# Patient Record
Sex: Female | Born: 1935 | Race: White | Hispanic: No | Smoking: Never smoker
Health system: Southern US, Community
[De-identification: ages and names within clinical notes are randomized; demographics above are authoritative.]

## PROBLEM LIST (undated history)

## (undated) DIAGNOSIS — I719 Aortic aneurysm of unspecified site, without rupture: Secondary | ICD-10-CM

## (undated) DIAGNOSIS — I10 Essential (primary) hypertension: Secondary | ICD-10-CM

## (undated) DIAGNOSIS — I2510 Atherosclerotic heart disease of native coronary artery without angina pectoris: Secondary | ICD-10-CM

## (undated) HISTORY — PX: ABDOMINAL HYSTERECTOMY: SHX81

## (undated) HISTORY — PX: KNEE ARTHROPLASTY: SHX992

---

## 2012-10-13 ENCOUNTER — Emergency Department (HOSPITAL_COMMUNITY): Payer: Medicare Other

## 2012-10-13 ENCOUNTER — Encounter (HOSPITAL_COMMUNITY): Payer: Self-pay | Admitting: Emergency Medicine

## 2012-10-13 ENCOUNTER — Observation Stay (HOSPITAL_COMMUNITY)
Admission: EM | Admit: 2012-10-13 | Discharge: 2012-10-14 | Disposition: A | Discharge status: Home / Self Care | Payer: Medicare Other | Attending: Emergency Medicine | Admitting: Emergency Medicine

## 2012-10-13 DIAGNOSIS — I7121 Aneurysm of the ascending aorta, without rupture: Secondary | ICD-10-CM | POA: Diagnosis present

## 2012-10-13 DIAGNOSIS — I712 Thoracic aortic aneurysm, without rupture, unspecified: Secondary | ICD-10-CM | POA: Insufficient documentation

## 2012-10-13 DIAGNOSIS — Z79899 Other long term (current) drug therapy: Secondary | ICD-10-CM | POA: Insufficient documentation

## 2012-10-13 DIAGNOSIS — I498 Other specified cardiac arrhythmias: Principal | ICD-10-CM | POA: Insufficient documentation

## 2012-10-13 DIAGNOSIS — R001 Bradycardia, unspecified: Secondary | ICD-10-CM | POA: Diagnosis present

## 2012-10-13 DIAGNOSIS — I10 Essential (primary) hypertension: Secondary | ICD-10-CM | POA: Diagnosis present

## 2012-10-13 DIAGNOSIS — Z7982 Long term (current) use of aspirin: Secondary | ICD-10-CM | POA: Insufficient documentation

## 2012-10-13 DIAGNOSIS — R42 Dizziness and giddiness: Secondary | ICD-10-CM | POA: Diagnosis present

## 2012-10-13 DIAGNOSIS — I2510 Atherosclerotic heart disease of native coronary artery without angina pectoris: Secondary | ICD-10-CM | POA: Insufficient documentation

## 2012-10-13 HISTORY — DX: Atherosclerotic heart disease of native coronary artery without angina pectoris: I25.10

## 2012-10-13 HISTORY — DX: Essential (primary) hypertension: I10

## 2012-10-13 HISTORY — DX: Aortic aneurysm of unspecified site, without rupture: I71.9

## 2012-10-13 LAB — CBC
HCT: 41.7 % (ref 36.0–46.0)
Hemoglobin: 14.2 g/dL (ref 12.0–15.0)
MCV: 87.2 fL (ref 78.0–100.0)
RBC: 4.78 MIL/uL (ref 3.87–5.11)
WBC: 4.9 10*3/uL (ref 4.0–10.5)

## 2012-10-13 LAB — URINALYSIS, ROUTINE W REFLEX MICROSCOPIC
Glucose, UA: NEGATIVE mg/dL
Nitrite: NEGATIVE
Protein, ur: NEGATIVE mg/dL
pH: 5.5 (ref 5.0–8.0)

## 2012-10-13 LAB — PHOSPHORUS: Phosphorus: 3.5 mg/dL (ref 2.3–4.6)

## 2012-10-13 LAB — BASIC METABOLIC PANEL
BUN: 10 mg/dL (ref 6–23)
CO2: 27 mEq/L (ref 19–32)
Chloride: 104 mEq/L (ref 96–112)
Creatinine, Ser: 0.74 mg/dL (ref 0.50–1.10)

## 2012-10-13 LAB — PRO B NATRIURETIC PEPTIDE: Pro B Natriuretic peptide (BNP): 289.4 pg/mL (ref 0–450)

## 2012-10-13 LAB — URINE MICROSCOPIC-ADD ON

## 2012-10-13 MED ORDER — ASPIRIN EC 81 MG PO TBEC
81.0000 mg | DELAYED_RELEASE_TABLET | Freq: Every day | ORAL | Status: DC
  Administered 2012-10-14: 81 mg via ORAL
  Filled 2012-10-13 (×2): qty 1

## 2012-10-13 MED ORDER — HYDROCODONE-ACETAMINOPHEN 5-325 MG PO TABS: 1.0000 | ORAL_TABLET | ORAL | Status: DC | PRN

## 2012-10-13 MED ORDER — VITAMIN C 500 MG PO TABS
1000.0000 mg | ORAL_TABLET | Freq: Every day | ORAL | Status: DC
  Administered 2012-10-14: 1000 mg via ORAL
  Filled 2012-10-13 (×2): qty 2

## 2012-10-13 MED ORDER — POTASSIUM CHLORIDE IN NACL 20-0.9 MEQ/L-% IV SOLN
INTRAVENOUS | Status: DC
  Administered 2012-10-13: 17:00:00 via INTRAVENOUS
  Filled 2012-10-13 (×3): qty 1000

## 2012-10-13 MED ORDER — ONDANSETRON HCL 4 MG PO TABS: 4.0000 mg | ORAL_TABLET | Freq: Four times a day (QID) | ORAL | Status: DC | PRN

## 2012-10-13 MED ORDER — ALUM & MAG HYDROXIDE-SIMETH 200-200-20 MG/5ML PO SUSP: 30.0000 mL | Freq: Four times a day (QID) | ORAL | Status: DC | PRN

## 2012-10-13 MED ORDER — VITAMIN C 500 MG PO TABS
500.0000 mg | ORAL_TABLET | Freq: Every day | ORAL | Status: DC
  Filled 2012-10-13 (×2): qty 1

## 2012-10-13 MED ORDER — SODIUM CHLORIDE 0.9 % IJ SOLN
3.0000 mL | Freq: Two times a day (BID) | INTRAMUSCULAR | Status: DC
  Administered 2012-10-14: 3 mL via INTRAVENOUS

## 2012-10-13 MED ORDER — LOSARTAN POTASSIUM 50 MG PO TABS
50.0000 mg | ORAL_TABLET | Freq: Every day | ORAL | Status: DC
  Administered 2012-10-14: 50 mg via ORAL
  Filled 2012-10-13 (×2): qty 1

## 2012-10-13 MED ORDER — ONDANSETRON HCL 4 MG/2ML IJ SOLN: 4.0000 mg | Freq: Four times a day (QID) | INTRAMUSCULAR | Status: DC | PRN

## 2012-10-13 MED ORDER — HEPARIN SODIUM (PORCINE) 5000 UNIT/ML IJ SOLN
5000.0000 [IU] | Freq: Three times a day (TID) | INTRAMUSCULAR | Status: DC
  Administered 2012-10-13 – 2012-10-14 (×3): 5000 [IU] via SUBCUTANEOUS
  Filled 2012-10-13 (×6): qty 1

## 2012-10-13 MED ORDER — ACETAMINOPHEN 325 MG PO TABS: 650.0000 mg | ORAL_TABLET | Freq: Four times a day (QID) | ORAL | Status: DC | PRN

## 2012-10-13 MED ORDER — NITROGLYCERIN 0.4 MG SL SUBL: 0.4000 mg | SUBLINGUAL_TABLET | SUBLINGUAL | Status: DC | PRN

## 2012-10-13 MED ORDER — ACETAMINOPHEN 650 MG RE SUPP: 650.0000 mg | Freq: Four times a day (QID) | RECTAL | Status: DC | PRN

## 2012-10-13 NOTE — ED Notes (Signed)
Pt reports dizziness that began at 7am this morning. Also had SOB and "jerking feeling". Denies CP.

## 2012-10-13 NOTE — H&P (Signed)
[facility] History and Physical  Kathryn [name] JXB:[id number] DOB: 10-04-1931 DOA: 10/13/2012  Referring physician: Bonnita Levan. Bernette Mayers, MD PCP: Gildardo Griffes, MD   Chief Complaint: Dizziness  HPI: Kathryn [name] is a 79 y.o. female with past medical history of hypertension, dilated aortic root followed by cardiology in [place]. She 2 episodes of dizziness this morning. She was in her usual several still this morning when she's trying to fix her breakfast, she got dizzy and she felt she is going to black out but she did not, she did have some shortness of breath with it, denies any chest pain, palpitations or chest tightness. She denies vertigo, she had similar episode before, and for that she was evaluated in the hospital she was been told it was a small heart attack. Patient came into the hospital, upon initial evaluation in the emergency department she was found to have significant bradycardia with heart rate in the 40s to 50s, 12-lead EKG showed sinus bradycardia, patient is on atenolol 25 mg by mouth twice a day.  Review of Systems: Constitutional: negative for anorexia, fevers and sweats Eyes: negative for irritation, redness and visual disturbance Ears, nose, mouth, throat, and face: negative for earaches, epistaxis, nasal congestion and sore throat Respiratory: negative for cough, dyspnea on exertion, sputum and wheezing Cardiovascular: Dizziness and presyncope, denies chest pain, dyspnea, orthopnea, palpitation. Gastrointestinal: negative for abdominal pain, constipation, diarrhea, melena, nausea and vomiting Genitourinary:negative for dysuria, frequency and hematuria Hematologic/lymphatic: negative for bleeding, easy bruising and lymphadenopathy Musculoskeletal:negative for arthralgias, muscle weakness and stiff joints Neurological: negative for coordination problems, gait problems, headaches and weakness Endocrine: negative for diabetic symptoms including polydipsia, polyuria and  weight loss Allergic/Immunologic: negative for anaphylaxis, hay fever and urticaria   Past Medical History  Diagnosis Date  . Aortic aneurysm   . Hypertension   . Coronary artery disease    Past Surgical History  Procedure Laterality Date  . Abdominal hysterectomy    . Knee arthroplasty     Social History:  reports that she has never smoked. She does not have any smokeless tobacco history on file. She reports that she does not drink alcohol or use illicit drugs. Lives at home with her husband  No Known Allergies  Family History  Problem Relation Age of Onset  . CVA Father    denies any premature heart disease  Prior to Admission medications   Medication Sig Start Date End Date Taking? Authorizing Provider  aspirin EC 81 MG tablet Take 81 mg by mouth daily.   Yes Historical Provider, MD  atenolol (TENORMIN) 25 MG tablet Take 25 mg by mouth 2 (two) times daily.   Yes Historical Provider, MD  BIOTIN PO Take 1 tablet by mouth daily.   Yes Historical Provider, MD  losartan (COZAAR) 50 MG tablet Take 50 mg by mouth daily.   Yes Historical Provider, MD  MAGNESIUM PO Take 1 tablet by mouth daily.   Yes Historical Provider, MD  vitamin C (ASCORBIC ACID) 500 MG tablet Take 500-1,000 mg by mouth 2 (two) times daily. Takes 2 tablets in am and 1 tablet in pm   Yes Historical Provider, MD   Physical Exam: Filed Vitals:   10/13/12 1415 10/13/12 1445 10/13/12 1545 10/13/12 1620  BP: 121/55 151/71 107/55 150/72  Pulse: 49 52    Temp:    98.2 F (36.8 C)  TempSrc:      Resp: 16 13 17 18   Height:    4\' 11"  (1.499 m)  Weight:  56.337 kg (124 lb 3.2 oz)  SpO2: 99% 98%  100%   General appearance: alert, cooperative and no distress  Head: Normocephalic, without obvious abnormality, atraumatic  Eyes: conjunctivae/corneas clear. PERRL, EOM's intact. Fundi benign.  Nose: Nares normal. Septum midline. Mucosa normal. No drainage or sinus tenderness.  Throat: lips, mucosa, and tongue normal;  teeth and gums normal  Neck: Supple, no masses, no cervical lymphadenopathy, no JVD appreciated, no meningeal signs Resp: clear to auscultation bilaterally  Chest wall: no tenderness  Cardio: Bradycardic, she has faint systolic murmur, best heard on the second intercostal space GI: soft, non-tender; bowel sounds normal; no masses, no organomegaly  Extremities: extremities normal, atraumatic, no cyanosis or edema  Skin: Skin color, texture, turgor normal. No rashes or lesions  Neurologic: Alert and oriented X 3, normal strength and tone. Normal symmetric reflexes. Normal coordination and gait   Labs on Admission:  Basic Metabolic Panel:  Recent Labs Lab 10/13/12 1213  NA 141  K 4.1  CL 104  CO2 27  GLUCOSE 106*  BUN 10  CREATININE 0.74  CALCIUM 10.2   Liver Function Tests: No results found for this basename: AST, ALT, ALKPHOS, BILITOT, PROT, ALBUMIN,  in the last 168 hours No results found for this basename: LIPASE, AMYLASE,  in the last 168 hours No results found for this basename: AMMONIA,  in the last 168 hours CBC:  Recent Labs Lab 10/13/12 1213  WBC 4.9  HGB 14.2  HCT 41.7  MCV 87.2  PLT 166   Cardiac Enzymes: No results found for this basename: CKTOTAL, CKMB, CKMBINDEX, TROPONINI,  in the last 168 hours  BNP (last 3 results)  Recent Labs  10/13/12 1213  PROBNP 289.4   CBG: No results found for this basename: GLUCAP,  in the last 168 hours  Radiological Exams on Admission: Dg Chest 2 View  10/13/2012  *RADIOLOGY REPORT*  Clinical Data: Shortness of breath, dizziness, jittery the, history hypertension, abdominal aortic aneurysm, coronary artery disease  CHEST - 2 VIEW  Comparison: None  Findings: Enlargement of cardiac silhouette. Tortuous thoracic aorta. Mediastinal contours and pulmonary vascularity otherwise normal. Bronchitic changes without infiltrate, pleural effusion, or pneumothorax. Questionable nodular density 16 x 9 mm versus prominent vascular  marking or summation artifact projecting over the lower medial left chest and heart. Diffuse osseous demineralization with thoracolumbar scoliosis.  IMPRESSION: Enlargement of cardiac silhouette. Bronchitic changes without acute infiltrate. Questionable nodular density lower left chest versus summation artifact or prominent vascular marking; recommend CT chest to exclude pulmonary nodule.   Original Report Authenticated By: Ulyses Southward, M.D.     EKG: Independently reviewed.   Assessment/Plan Principal Problem:   Dizziness and giddiness Active Problems:   Bradycardia   Ascending aortic aneurysm   HTN (hypertension)   Dizziness -Likely secondary to bradycardia, patient does not have any prodrome or loss of consciousness. -No focal neurological findings I will not scan her head. -I will hold her atenolol for today, probably can have half of it in the morning. -Rule out acute coronary syndrome and check 2-D echocardiogram, continue aspirin.  Bradycardia -Causing the dizziness, likely secondary to beta blockers. -12-lead EKG showed sinus bradycardiac, no evidence of bradyarrhythmias, no AV/heart blocks. -Place patient on telemetry for observation, hold her atenolol. -Likely she will need to be on half of the dose as she will need that for her ascending aortic aneurysm.   Hypertension -Continue losartan, hold atenolol for now. -Blood pressure is reasonable, there was a reading of 180 when  she came in but now systolic is around 120.  Ascending aortic aneurysm -Aortic root enlarged on the x-rays, 2-D echocardiogram will be done to further evaluate that. -Beta blocker is a most recommended medication to slow the growth of the ascending aortic aneurysm. -With the new episode of bradycardia she might need only half of the dose of which is getting.  Code Status: Full code Family Communication: Discussed with the patient and her husband as well as her daughter is at bedside. Disposition Plan:  Observation, likely overnight  Time spent: 70 minutes  [facility] Pager [phone]  If 7PM-7AM, please contact night-coverage [email or web address] Password [facility] 10/13/2012, 5:16 PM

## 2012-10-13 NOTE — Progress Notes (Signed)
Pt arrived to floor from ED. Dr Arthor Captain paged to be made aware. Will await admitting orders from MD. Levonne Spiller, RN

## 2012-10-13 NOTE — ED Provider Notes (Signed)
History     CSN: [id number]  Arrival date & time 10/13/12  1155   First MD Initiated Contact with Patient 10/13/12 1432      Chief Complaint  Patient presents with  . Shortness of Breath    (Consider location/radiation/quality/duration/timing/severity/associated sxs/prior treatment) Patient is a 79 y.o. female presenting with shortness of breath.  Shortness of Breath  Pt with history of HOCM and dilated aortic root followed by cardiology in [facility] reports she had 2 episodes of feeling dizzy this morning. Denies LOC, no chest pain but some mild SOB. Denies any nausea vomiting, cough congestion or fever. She is asymptomatic at the time of my evaluation.   Past Medical History  Diagnosis Date  . Aortic aneurysm   . Hypertension   . Coronary artery disease     Past Surgical History  Procedure Laterality Date  . Abdominal hysterectomy    . Knee arthroplasty      No family history on file.  History  Substance Use Topics  . Smoking status: Never Smoker   . Smokeless tobacco: Not on file  . Alcohol Use: No    OB History   Grav Para Term Preterm Abortions TAB SAB Ect Mult Living                  Review of Systems  Respiratory: Positive for shortness of breath.    All other systems reviewed and are negative except as noted in HPI.   Allergies  Review of patient's allergies indicates no known allergies.  Home Medications   Current Outpatient Rx  Name  Route  Sig  Dispense  Refill  . aspirin EC 81 MG tablet   Oral   Take 81 mg by mouth daily.         Marland Kitchen atenolol (TENORMIN) 25 MG tablet   Oral   Take 25 mg by mouth 2 (two) times daily.         Marland Kitchen BIOTIN PO   Oral   Take 1 tablet by mouth daily.         Marland Kitchen losartan (COZAAR) 50 MG tablet   Oral   Take 50 mg by mouth daily.         Marland Kitchen MAGNESIUM PO   Oral   Take 1 tablet by mouth daily.         . vitamin C (ASCORBIC ACID) 500 MG tablet   Oral   Take 500-1,000 mg by mouth 2 (two) times daily.  Takes 2 tablets in am and 1 tablet in pm           BP 121/55  Pulse 49  Temp(Src) 98.3 F (36.8 C) (Oral)  Resp 16  Wt 125 lb (56.7 kg)  SpO2 99%  Physical Exam  Nursing note and vitals reviewed. Constitutional: She is oriented to person, place, and time. She appears well-developed and well-nourished.  HENT:  Head: Normocephalic and atraumatic.  Eyes: EOM are normal. Pupils are equal, round, and reactive to light.  Neck: Normal range of motion. Neck supple.  Cardiovascular: Normal heart sounds and intact distal pulses.   bradycardia  Pulmonary/Chest: Effort normal and breath sounds normal.  Abdominal: Bowel sounds are normal. She exhibits no distension. There is no tenderness.  Musculoskeletal: Normal range of motion. She exhibits no edema and no tenderness.  Neurological: She is alert and oriented to person, place, and time. She has normal strength. No cranial nerve deficit or sensory deficit.  Skin: Skin is warm and dry.  No rash noted.  Psychiatric: She has a normal mood and affect.    ED Course  Procedures (including critical care time)  Labs Reviewed  BASIC METABOLIC PANEL - Abnormal; Notable for the following:    Glucose, Bld 106 (*)    GFR calc non Af Amer 79 (*)    All other components within normal limits  CBC  PRO B NATRIURETIC PEPTIDE  URINALYSIS, ROUTINE W REFLEX MICROSCOPIC  POCT I-STAT TROPONIN I   Dg Chest 2 View  10/13/2012  *RADIOLOGY REPORT*  Clinical Data: Shortness of breath, dizziness, jittery the, history hypertension, abdominal aortic aneurysm, coronary artery disease  CHEST - 2 VIEW  Comparison: None  Findings: Enlargement of cardiac silhouette. Tortuous thoracic aorta. Mediastinal contours and pulmonary vascularity otherwise normal. Bronchitic changes without infiltrate, pleural effusion, or pneumothorax. Questionable nodular density 16 x 9 mm versus prominent vascular marking or summation artifact projecting over the lower medial left chest and  heart. Diffuse osseous demineralization with thoracolumbar scoliosis.  IMPRESSION: Enlargement of cardiac silhouette. Bronchitic changes without acute infiltrate. Questionable nodular density lower left chest versus summation artifact or prominent vascular marking; recommend CT chest to exclude pulmonary nodule.   Original Report Authenticated By: Ulyses Southward, M.D.      1. Bradycardia       MDM   Date: 10/13/2012  Rate: 55  Rhythm: sinus bradycardia  QRS Axis: normal  Intervals: normal  ST/T Wave abnormalities: normal  Conduction Disutrbances:none  Narrative Interpretation:   Old EKG Reviewed: none available  Pt with HR in 40-50 in the ED. She has a stable dilated aortic root related to HOCM. She has symptomatic bradycardia here, taking beta blocker. Discussed with hospitalist who will admit for further eval.        Charles B. Bernette Mayers, MD 10/13/12 [phone]

## 2012-10-14 DIAGNOSIS — I059 Rheumatic mitral valve disease, unspecified: Secondary | ICD-10-CM

## 2012-10-14 LAB — CBC
HCT: 39.8 % (ref 36.0–46.0)
Hemoglobin: 13.2 g/dL (ref 12.0–15.0)
MCHC: 33.2 g/dL (ref 30.0–36.0)
RBC: 4.53 MIL/uL (ref 3.87–5.11)

## 2012-10-14 LAB — BASIC METABOLIC PANEL
BUN: 9 mg/dL (ref 6–23)
Chloride: 107 mEq/L (ref 96–112)
GFR calc Af Amer: >90 mL/min (ref >90)
Potassium: 4.2 mEq/L (ref 3.5–5.1)
Sodium: 143 mEq/L (ref 135–145)

## 2012-10-14 LAB — TSH: TSH: 0.617 u[IU]/mL (ref 0.350–4.500)

## 2012-10-14 LAB — HEMOGLOBIN A1C: Hgb A1c MFr Bld: 5.8 % — ABNORMAL HIGH (ref <5.7)

## 2012-10-14 NOTE — Progress Notes (Signed)
  Echocardiogram 2D Echocardiogram has been performed.  [name], Kathryn 10/14/2012, 12:01 PM

## 2012-10-14 NOTE — Progress Notes (Signed)
Utilization review completed. Bertha Stanfill, RN, BSN. 

## 2012-10-14 NOTE — Discharge Summary (Signed)
Physician Discharge Summary  Kathryn [name] JXB:[id number] DOB: [birth year] DOA: 10/13/2012  PCP: Gildardo Griffes, MD  Admit date: 10/13/2012 Discharge date: 10/14/2012  Time spent: 40 minutes  Recommendations for Outpatient Follow-up:  1. Patient to follow with regular cardiologist Dr. Luberta Robertson 10/15/2012  2. Patient's atenolol has been completely discontinued given bradycardia and syncopal type symptoms 3. Patient will need a copy of the echocardiogram performed on this admission faxed to cardiologist in the morning   Discharge Diagnoses:  Principal Problem:   Dizziness and giddiness Active Problems:   Bradycardia   Ascending aortic aneurysm   HTN (hypertension)   Discharge Condition: Stable  Diet recommendation: Heart healthy  Filed Weights   10/13/12 1202 10/13/12 1620  Weight: 56.7 kg (125 lb) 56.337 kg (124 lb 3.2 oz)    History of present illness:  79 year old Caucasian female history of aortic arch application as well as bradycardia on atenolol was admitted as an observation 10/14/2012 for 2 episodes of syncopal-like symptoms. She was monitored on telemetry and was discontinued off of her beta blocker. An echocardiogram was performed but the results of this is pending. EKG on admission showed heart rate in the 1450s with and this sustained but subsequently increased to the 60s during the daytime. Given inclement weather and no further recurrence of the same as well as fact that patient had been hydrated for potential orthostasis, it was felt reasonable to discharge the patient who has an appointment with primary cardiologist tomorrow and will receive a copy of the echocardiogram. I discussed this in detail with family and with the patient's daughter and they were amenable and agreeable to doing this.    Discharge Exam: Filed Vitals:   10/13/12 1620 10/13/12 2100 10/14/12 0610 10/14/12 1400  BP: 150/72 114/69 104/61 134/59  Pulse:  49 53 69  Temp: 98.2 F (36.8 C) 98 F (36.7  C) 97.6 F (36.4 C) 98.2 F (36.8 C)  TempSrc:  Oral Oral   Resp: 18 15 16 17   Height: 4\' 11"  (1.499 m)     Weight: 56.337 kg (124 lb 3.2 oz)     SpO2: 100% 90% 96% 97%   alert pleasant oriented ambulate  General: Alert Cardiovascular: S1-S2 no murmur rub or gallop telemetry sinus to sinus bradycardia in the 60s Respiratory: Clear  Discharge Instructions  Discharge Orders   Future Orders Complete By Expires     Call MD for:  hives  As directed     Call MD for:  persistant dizziness or light-headedness  As directed     Call MD for:  redness, tenderness, or signs of infection (pain, swelling, redness, odor or green/yellow discharge around incision site)  As directed     Call MD for:  severe uncontrolled pain  As directed     Call MD for:  temperature >100.4  As directed     Diet - low sodium heart healthy  As directed     Increase activity slowly  As directed         Medication List    STOP taking these medications       atenolol 25 MG tablet  Commonly known as:  TENORMIN     MAGNESIUM PO      TAKE these medications       aspirin EC 81 MG tablet  Take 81 mg by mouth daily.     BIOTIN PO  Take 1 tablet by mouth daily.     losartan 50 MG tablet  Commonly known as:  COZAAR  Take 50 mg by mouth daily.     vitamin C 500 MG tablet  Commonly known as:  ASCORBIC ACID  Take 500-1,000 mg by mouth 2 (two) times daily. Takes 2 tablets in am and 1 tablet in pm           Follow-up Information   Follow up with Gildardo Griffes, MD.   Contact information:   [address] [place] [address]       Follow up with CROWELL,CHARLES, MD On 10/14/2012.   Contact information:   [address] [place] [address] [phone]        The results of significant diagnostics from this hospitalization (including imaging, microbiology, ancillary and laboratory) are listed below for reference.    Significant Diagnostic Studies: Dg Chest 2 View  10/13/2012  *RADIOLOGY  REPORT*  Clinical Data: Shortness of breath, dizziness, jittery the, history hypertension, abdominal aortic aneurysm, coronary artery disease  CHEST - 2 VIEW  Comparison: None  Findings: Enlargement of cardiac silhouette. Tortuous thoracic aorta. Mediastinal contours and pulmonary vascularity otherwise normal. Bronchitic changes without infiltrate, pleural effusion, or pneumothorax. Questionable nodular density 16 x 9 mm versus prominent vascular marking or summation artifact projecting over the lower medial left chest and heart. Diffuse osseous demineralization with thoracolumbar scoliosis.  IMPRESSION: Enlargement of cardiac silhouette. Bronchitic changes without acute infiltrate. Questionable nodular density lower left chest versus summation artifact or prominent vascular marking; recommend CT chest to exclude pulmonary nodule.   Original Report Authenticated By: Ulyses Southward, M.D.     Microbiology: No results found for this or any previous visit (from the past 240 hour(s)).   Labs: Basic Metabolic Panel:  Recent Labs Lab 10/13/12 1213 10/13/12 1734 10/14/12 0605  NA 141  --  143  K 4.1  --  4.2  CL 104  --  107  CO2 27  --  29  GLUCOSE 106*  --  87  BUN 10  --  9  CREATININE 0.74  --  0.75  CALCIUM 10.2  --  9.1  MG  --  2.3  --   PHOS  --  3.5  --    Liver Function Tests: No results found for this basename: AST, ALT, ALKPHOS, BILITOT, PROT, ALBUMIN,  in the last 168 hours No results found for this basename: LIPASE, AMYLASE,  in the last 168 hours No results found for this basename: AMMONIA,  in the last 168 hours CBC:  Recent Labs Lab 10/13/12 1213 10/14/12 0605  WBC 4.9 3.7*  HGB 14.2 13.2  HCT 41.7 39.8  MCV 87.2 87.9  PLT 166 143*   Cardiac Enzymes:  Recent Labs Lab 10/13/12 1735 10/13/12 2324 10/14/12 0605  TROPONINI <0.30 <0.30 <0.30   BNP: BNP (last 3 results)  Recent Labs  10/13/12 1213  PROBNP 289.4   CBG: No results found for this basename:  GLUCAP,  in the last 168 hours     Signed:  Rhetta Mura  [facility] 10/14/2012, 4:54 PM

## 2013-11-07 ENCOUNTER — Ambulatory Visit: Payer: Self-pay

## 2013-11-07 ENCOUNTER — Other Ambulatory Visit: Payer: Self-pay | Admitting: Occupational Medicine

## 2013-11-07 DIAGNOSIS — M549 Dorsalgia, unspecified: Secondary | ICD-10-CM

## 2014-03-03 ENCOUNTER — Other Ambulatory Visit (HOSPITAL_COMMUNITY): Payer: Self-pay | Admitting: Physical Medicine and Rehabilitation

## 2014-03-03 DIAGNOSIS — S22069A Unspecified fracture of T7-T8 vertebra, initial encounter for closed fracture: Secondary | ICD-10-CM

## 2014-03-03 DIAGNOSIS — M546 Pain in thoracic spine: Secondary | ICD-10-CM

## 2014-03-07 ENCOUNTER — Ambulatory Visit (HOSPITAL_COMMUNITY)
Admission: RE | Admit: 2014-03-07 | Discharge: 2014-03-07 | Disposition: A | Discharge status: Home / Self Care | Payer: Medicare HMO | Source: Ambulatory Visit | Attending: Physical Medicine and Rehabilitation | Admitting: Physical Medicine and Rehabilitation

## 2014-03-07 DIAGNOSIS — M546 Pain in thoracic spine: Secondary | ICD-10-CM

## 2014-03-07 DIAGNOSIS — S22069A Unspecified fracture of T7-T8 vertebra, initial encounter for closed fracture: Secondary | ICD-10-CM

## 2015-10-03 IMAGING — CR DG THORACIC SPINE 2V
2 series · 2 of 2 positions shown · non-contrast
Comparison: DG CHEST 2 VIEW dated 10/13/2012; CT ANGIO CHEST dated
01/21/2011; DG CHEST 2V dated 01/21/2011

CLINICAL DATA: Injury with mid back pain.

EXAM:
THORACIC SPINE - 2 VIEW

[view not recorded (1 of 2)]
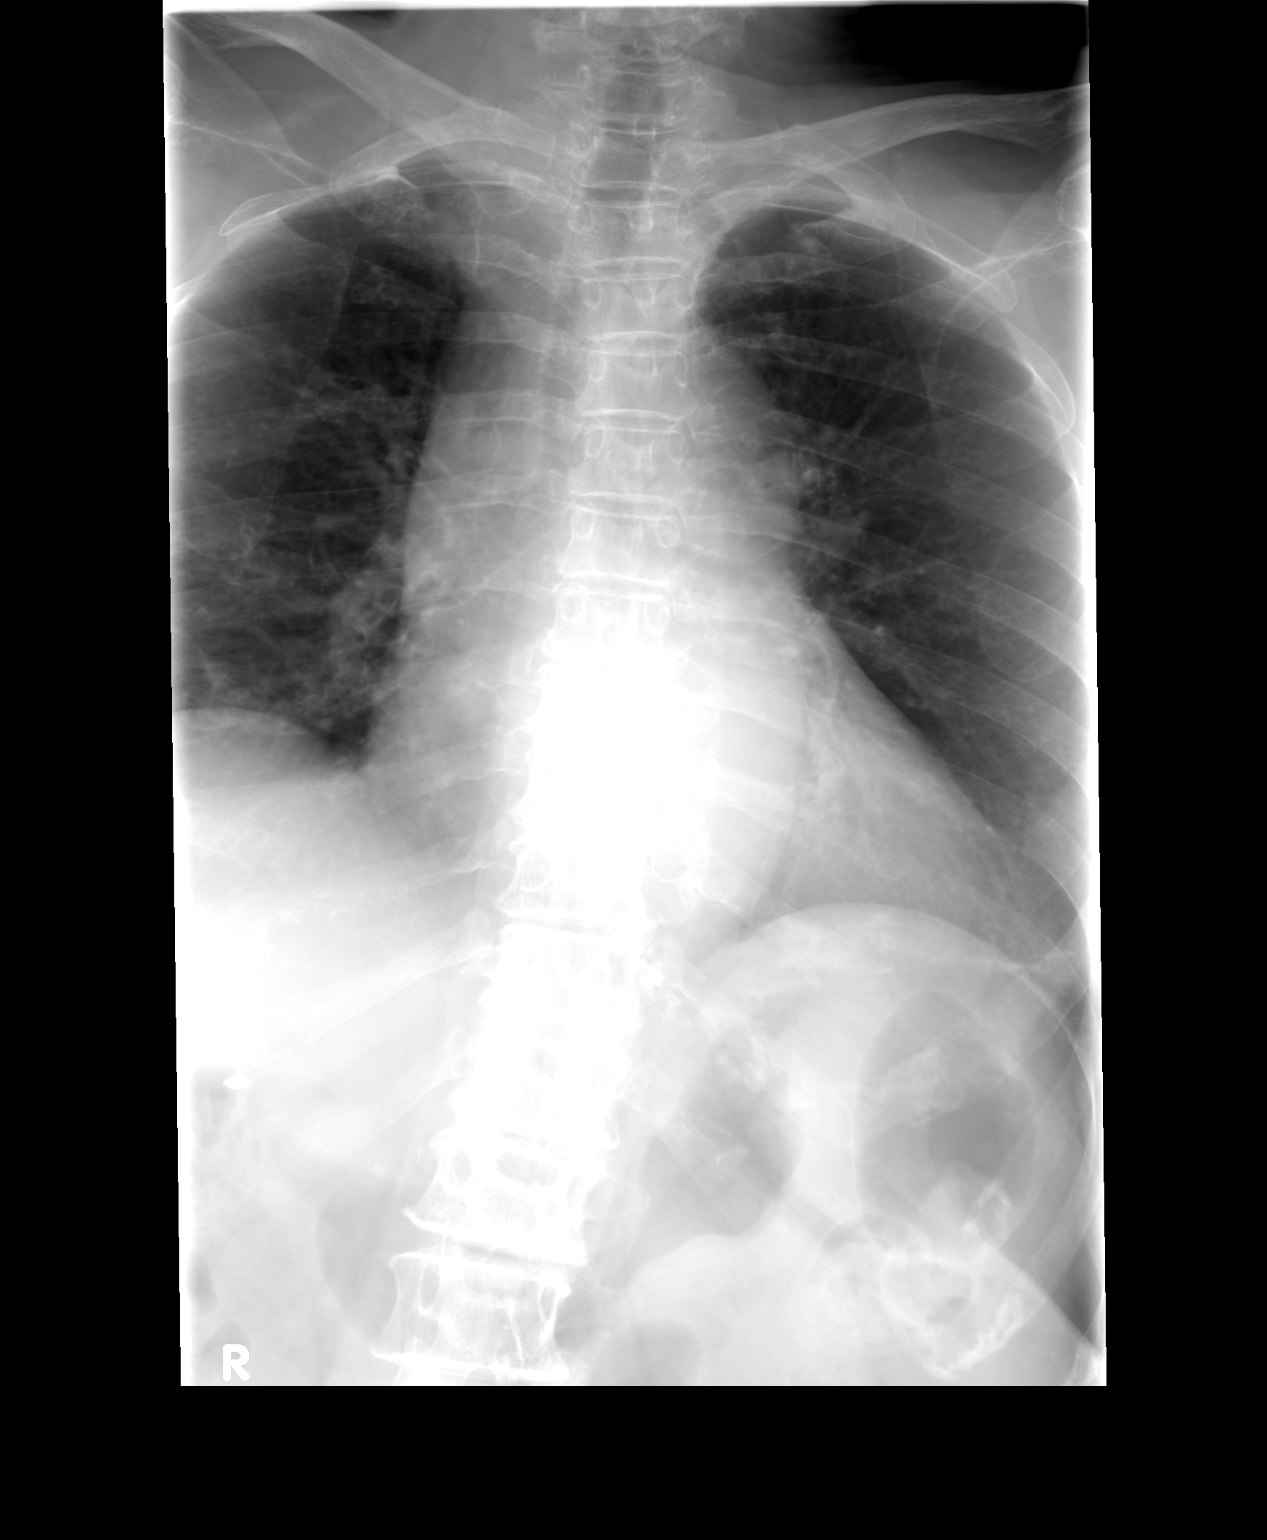

[view not recorded (2 of 2)]
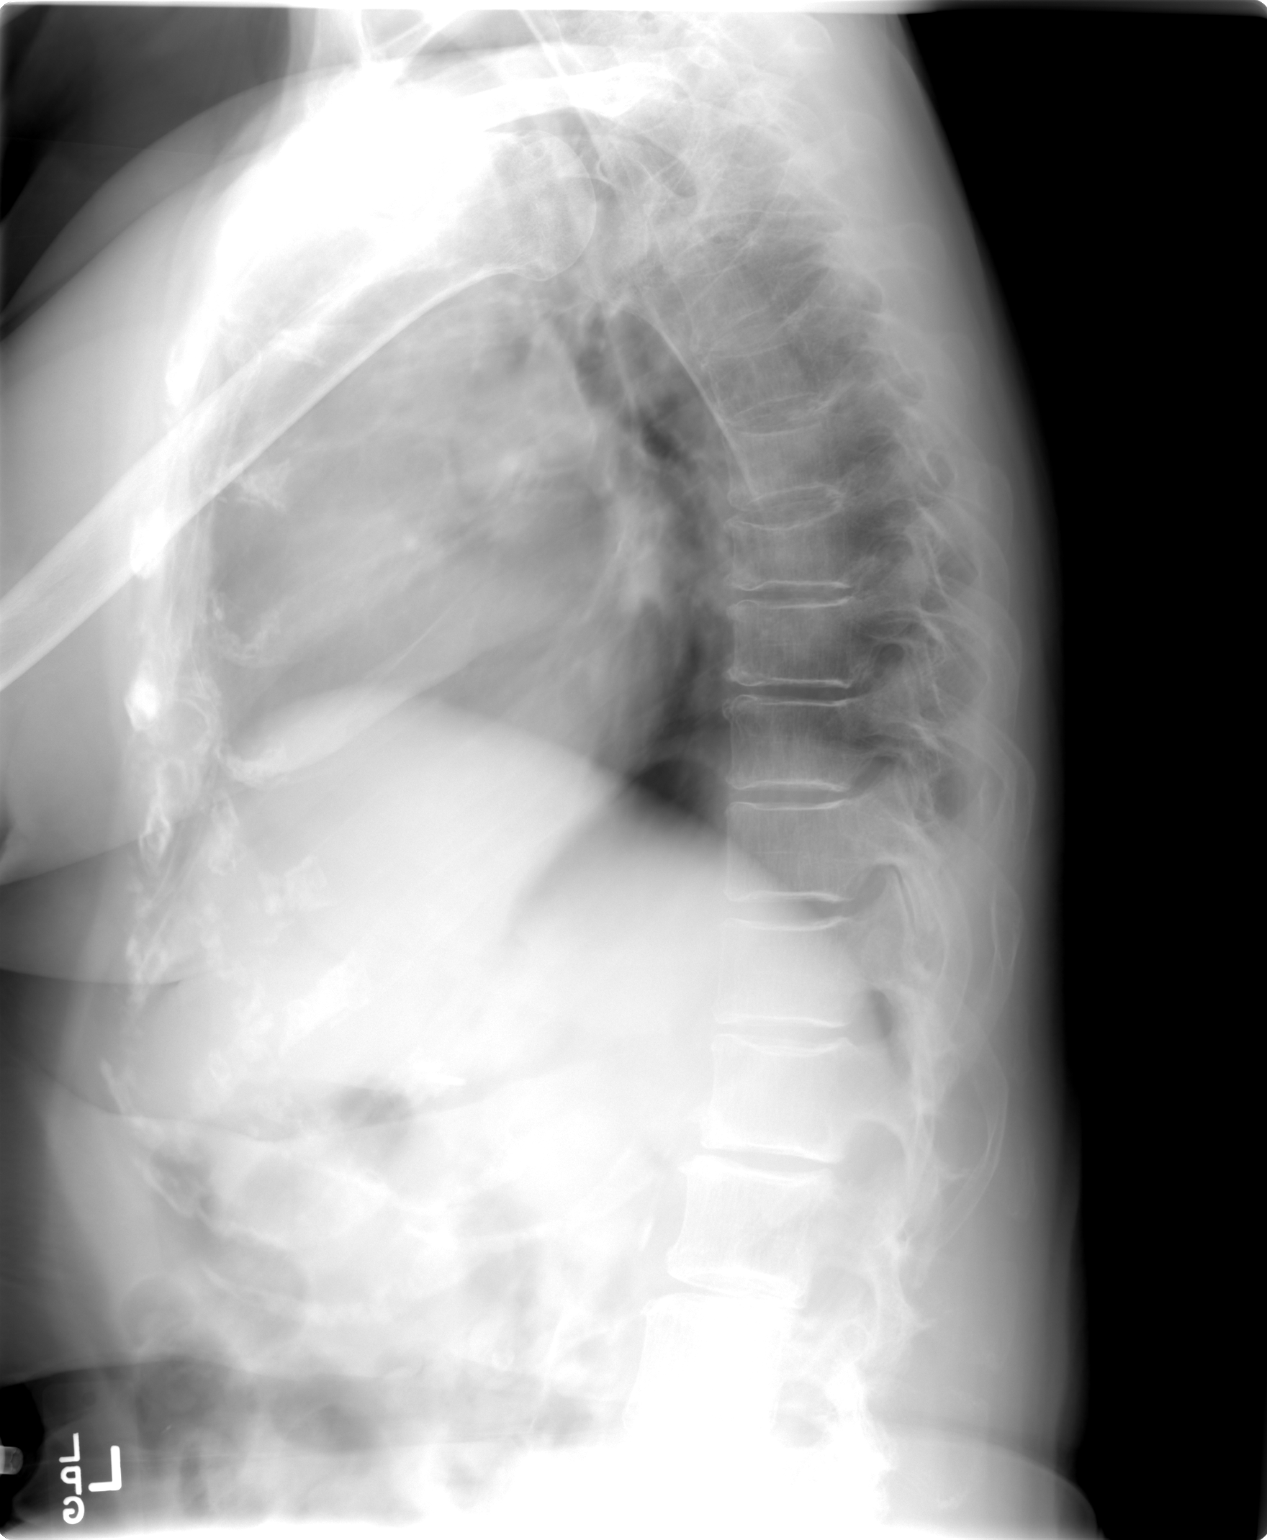

[2 of 2 positions shown; findings below may reference images not displayed]

FINDINGS: Compared to the lateral chest x-ray on [DATE], there may be some
slight interval loss of height of a thoracic vertebral body which is
likely the T7 level. No retropulsion. No other evidence of fracture
or subluxation. No bony lesions are seen.
IMPRESSION: Slight interval loss of height of a mid thoracic vertebral body
which is felt to be the T7 level.

## 2020-03-15 ENCOUNTER — Encounter (HOSPITAL_COMMUNITY): Payer: Self-pay

## 2020-03-15 ENCOUNTER — Emergency Department (HOSPITAL_COMMUNITY)
Admission: EM | Admit: 2020-03-15 | Discharge: 2020-03-15 | Disposition: A | Discharge status: Home / Self Care | Payer: Medicare Other | Attending: Emergency Medicine | Admitting: Emergency Medicine

## 2020-03-15 ENCOUNTER — Other Ambulatory Visit: Payer: Self-pay

## 2020-03-15 DIAGNOSIS — Z5321 Procedure and treatment not carried out due to patient leaving prior to being seen by health care provider: Secondary | ICD-10-CM | POA: Insufficient documentation

## 2020-03-15 DIAGNOSIS — L03116 Cellulitis of left lower limb: Secondary | ICD-10-CM | POA: Insufficient documentation

## 2020-03-15 LAB — COMPREHENSIVE METABOLIC PANEL
ALT: 23 U/L (ref 0–44)
AST: 107 U/L — ABNORMAL HIGH (ref 15–41)
Albumin: 3.5 g/dL (ref 3.5–5.0)
Alkaline Phosphatase: 168 U/L — ABNORMAL HIGH (ref 38–126)
Anion gap: 9 (ref 5–15)
BUN: 15 mg/dL (ref 8–23)
CO2: 27 mmol/L (ref 22–32)
Calcium: 9.9 mg/dL (ref 8.9–10.3)
Chloride: 102 mmol/L (ref 98–111)
Creatinine, Ser: 0.9 mg/dL (ref 0.44–1.00)
GFR calc Af Amer: >60 mL/min (ref >60)
GFR calc non Af Amer: 58 mL/min — ABNORMAL LOW (ref >60)
Glucose, Bld: 103 mg/dL — ABNORMAL HIGH (ref 70–99)
Potassium: 4.2 mmol/L (ref 3.5–5.1)
Sodium: 138 mmol/L (ref 135–145)
Total Bilirubin: 0.6 mg/dL (ref 0.3–1.2)
Total Protein: 7.1 g/dL (ref 6.5–8.1)

## 2020-03-15 LAB — CBC WITH DIFFERENTIAL/PLATELET
Abs Immature Granulocytes: 0.01 10*3/uL (ref 0.00–0.07)
Basophils Absolute: 0 10*3/uL (ref 0.0–0.1)
Basophils Relative: 1 %
Eosinophils Absolute: 0 10*3/uL (ref 0.0–0.5)
Eosinophils Relative: 1 %
HCT: 42.6 % (ref 36.0–46.0)
Hemoglobin: 13.4 g/dL (ref 12.0–15.0)
Immature Granulocytes: 0 %
Lymphocytes Relative: 28 %
Lymphs Abs: 1.2 10*3/uL (ref 0.7–4.0)
MCH: 28.6 pg (ref 26.0–34.0)
MCHC: 31.5 g/dL (ref 30.0–36.0)
MCV: 90.8 fL (ref 80.0–100.0)
Monocytes Absolute: 0.4 10*3/uL (ref 0.1–1.0)
Monocytes Relative: 9 %
Neutro Abs: 2.7 10*3/uL (ref 1.7–7.7)
Neutrophils Relative %: 61 %
Platelets: 137 10*3/uL — ABNORMAL LOW (ref 150–400)
RBC: 4.69 MIL/uL (ref 3.87–5.11)
RDW: 15.5 % (ref 11.5–15.5)
WBC: 4.3 10*3/uL (ref 4.0–10.5)
nRBC: 0 % (ref 0.0–0.2)

## 2020-03-15 LAB — URINALYSIS, ROUTINE W REFLEX MICROSCOPIC
Bacteria, UA: NONE SEEN
Bilirubin Urine: NEGATIVE
Glucose, UA: NEGATIVE mg/dL
Ketones, ur: NEGATIVE mg/dL
Leukocytes,Ua: NEGATIVE
Nitrite: NEGATIVE
Protein, ur: NEGATIVE mg/dL
Specific Gravity, Urine: 1.01 (ref 1.005–1.030)
pH: 5 (ref 5.0–8.0)

## 2020-03-15 LAB — LACTIC ACID, PLASMA: Lactic Acid, Venous: 0.9 mmol/L (ref 0.5–1.9)

## 2020-03-15 NOTE — ED Triage Notes (Signed)
Pt arrives POV for eval of L leg pain and infection. Pt cut leg last week, has been on 2 rounds of abx since that time and reports was sent here for IV abx by PCP. Cut is large, scabbed w/ surrounding erythema and edema.

## 2020-03-15 NOTE — ED Notes (Signed)
Patient states she has been here for 8 hours and she is leaving "and im not paying a bill cuz yall aint done nothing."

## 2021-09-13 ENCOUNTER — Telehealth: Payer: Self-pay

## 2021-09-13 NOTE — Telephone Encounter (Signed)
Attempted to contact patient/patient's daughter Kathryn [name] to schedule a Palliative Care consult appointment. No answer left a message to return call.

## 2021-09-17 ENCOUNTER — Telehealth: Payer: Self-pay

## 2021-09-17 NOTE — Telephone Encounter (Signed)
Attempted to contact patient, patient's daughter Clarene Critchley and patient's friend Shirlean Mylar to schedule a Palliative Care consult appointment. No answer left a message for daughter to return call. Robin's number and patient's mobile number are invalid.

## 2021-09-23 ENCOUNTER — Telehealth: Payer: Self-pay

## 2021-09-23 NOTE — Telephone Encounter (Signed)
Spoke with patient and scheduled a Telephonic Palliative Consult for 09/30/21 @ 1:30 PM.   Consent obtained; updated Outlook/Netsmart/Team List and Epic.

## 2021-09-30 ENCOUNTER — Other Ambulatory Visit: Payer: Self-pay

## 2021-09-30 ENCOUNTER — Other Ambulatory Visit: Payer: Self-pay | Admitting: Internal Medicine

## 2021-09-30 DIAGNOSIS — G8929 Other chronic pain: Secondary | ICD-10-CM

## 2021-09-30 DIAGNOSIS — Z515 Encounter for palliative care: Secondary | ICD-10-CM

## 2021-09-30 DIAGNOSIS — F4321 Adjustment disorder with depressed mood: Secondary | ICD-10-CM

## 2021-09-30 NOTE — Progress Notes (Signed)
Therapist, nutritional Palliative Care Consult Note Telephone: [phone]  Fax: [phone]   Date of encounter: 09/30/21 9:40 AM PATIENT NAME: Kathryn [name] [address] Kathryn [name] [place] [address]   [phone] (home)  DOB: Apr 17, 1932 MRN: [id number]  PRIMARY CARE PROVIDER:    Mattie Marlin, MD,  [address] [place] [address] [phone]  REFERRING PROVIDER:   Mattie Marlin, MD [address] [place],  [place] [address] [phone] [facility]  RESPONSIBLE PARTY:    Contact Information     Name Relation Home Work Mobile   [place] Other      powell,Kathryn [name] Daughter [phone]  [phone]       Due to the COVID-19 crisis, this visit was done via telemedicine from my office and it was initiated and consent by this patient and or family.  I connected with  Kathryn [name] OR PROXY on 09/30/21 by a telemedicine application and verified that I am speaking with the correct person using two identifiers.   I discussed the limitations of evaluation and management by telemedicine. The patient expressed understanding and agreed to proceed.  Palliative Care was asked to follow this patient by consultation request of  Kathryn Marlin, MD to address advance care planning and complex medical decision making. This is the initial visit.                                     ASSESSMENT AND PLAN / RECOMMENDATIONS:   Advance Care Planning/Goals of Care: Goals include to maximize quality of life and symptom management. Patient/health care surrogate gave his/her permission to discuss.Our advance care planning conversation included a discussion about:    The value and importance of advance care planning  Experiences with loved ones who have been seriously ill or have died  Exploration of personal, cultural or spiritual beliefs that might influence medical decisions  Exploration of goals of care in the event of a  sudden injury or illness  Identification  of a healthcare agent  Review and updating or creation of an  advance directive document . Decision not to resuscitate or to de-escalate disease focused treatments due to poor prognosis. CODE STATUS:  need to address next time  Symptom Management/Plan: 1. Grief -biggest issue at this time and felt to influence her pain -declines medications for mood -agreeable to grief counseling and I've asked social work to orchestrate this with our program  2. Chronic midline low back pain without sciatica -continue current mgt, reassess in person  3. Palliative care by specialist -will address ACP at f/u visit.   -visit today spent on evaluation of grief/role in pain, listening to her concerns and providing support    Follow up Palliative Care Visit: Palliative care will continue to follow for complex medical decision making, advance care planning, and clarification of goals. Return 8 weeks or prn.  32 minutes spent on call  PPS: 50%  HOSPICE ELIGIBILITY/DIAGNOSIS: TBD  Chief Complaint: new palliative care consult  HISTORY OF PRESENT ILLNESS:  Kathryn [name] is a 88 y.o. year old female  with hypertrophic cardiomyopathy, aortic aneurysm w/o rupture, low back pain and grief.    She has been having back pain and we are consulted for symptom management.  She lost two children in January and her back started to bother her after her son died.  Pain was pretty good yesterday.  She was able to  go to church last night.  Then sitting on the pews, it got bad and it hurt this am.  It's getting a little better now.  She takes tramadol 50mg  up to twice a day as needed.  One does ok until she does something that makes it worse.  Able to get up and down a little better.  Does not need a cane or walker to get around.  Sister lives with her--she is older than pt.  Pt looks after her sister who is on a walker and falls often.    She's hard to get along with.  She does take  a sleeping pill that's cut down to one pill.  Sleeps 2-3 hrs after a 1/2 pill and then wakes up.  Pt is afraid to take the full pill for fear she won't hear her sister if she falls.  Pt stays up late watching tv.  Pt's daughter comes and sweeps the floors and such.  Pt manages the meds.  She has really not felt good since her two children died.  Eldest daughter died 38/4, son died 2022/08/29.  Says preacher only brought some chicken when son died, but hasn't heard from him since.  Needs someone to talk to.  She says she is not depressed--she doesn't want to take medication for this.    Her daughter had cancer of her tongue--cancer came back at Christmas, son-in-law died from covid after taking care of her.  Her son had heart disease and was to f/u at cardiology, but when he went to the church, his [id number] wasn't working and he got out to push it, had a heart attack and died.  He helped take care of pt and her sister.  She says he would have taken care of her for years if he'd lived.  Pt does dishes herself.  Daughter brings them both plates of food three days a week.    Talked about constipation with her tramadol--uses metamucil and stool softener.    History obtained from review of EMR, discussion with primary team, and interview with family, facility staff/caregiver and/or Kathryn [name].   I reviewed available labs, medications, imaging, studies and related documents from the EMR.  Records reviewed and summarized above.   ROS  General: NAD EYES: denies vision changes ENMT: denies dysphagia Cardiovascular: denies chest pain, denies DOE Pulmonary: denies cough, denies increased SOB Abdomen: endorses decreased appetite, denies constipation, endorses continence of bowel GU: denies dysuria, endorses continence of urine MSK:  has increased weakness,  no falls reported Skin: denies rashes or wounds Neurological: low back pain, intermittent insomnia Psych: Endorses grief, denies depression Heme/lymph/immuno:  denies bruises, abnormal bleeding  Physical Exam: Current and past weights: 125 lbs in August 2021; need more recent weight from [place] notes  CURRENT PROBLEM LIST:  Patient Active Problem List   Diagnosis Date Noted   Dizziness and giddiness 10/13/2012   Bradycardia 10/13/2012   Ascending aortic aneurysm 10/13/2012   HTN (hypertension) 10/13/2012   PAST MEDICAL HISTORY:  Active Ambulatory Problems    Diagnosis Date Noted   Dizziness and giddiness 10/13/2012   Bradycardia 10/13/2012   Ascending aortic aneurysm 10/13/2012   HTN (hypertension) 10/13/2012   Resolved Ambulatory Problems    Diagnosis Date Noted   No Resolved Ambulatory Problems   Past Medical History:  Diagnosis Date   Aortic aneurysm (HCC)    Coronary artery disease    Hypertension    SOCIAL HX:  Social History   Tobacco Use  Smoking status: Never   Smokeless tobacco: Not on file  Substance Use Topics   Alcohol use: No   FAMILY HX:  Family History  Problem Relation Age of Onset   CVA Father       ALLERGIES: No Known Allergies    PERTINENT MEDICATIONS: need updated list from [facility] med Outpatient Encounter Medications as of 09/30/2021  Medication Sig   aspirin EC 81 MG tablet Take 81 mg by mouth daily.   BIOTIN PO Take 1 tablet by mouth daily.   losartan (COZAAR) 50 MG tablet Take 50 mg by mouth daily.   vitamin C (ASCORBIC ACID) 500 MG tablet Take 500-1,000 mg by mouth 2 (two) times daily. Takes 2 tablets in am and 1 tablet in pm   No facility-administered encounter medications on file as of 09/30/2021.   Thank you for the opportunity to participate in the care of Ms. Michela.  The palliative care team will continue to follow. Please call our office at [phone] if we can be of additional assistance.   Bufford Spikes, DO   COVID-19 PATIENT SCREENING TOOL Asked and negative response unless otherwise noted:  Have you had symptoms of covid, tested positive or been in contact with someone  with symptoms/positive test in the past 5-10 days? no

## 2021-10-01 ENCOUNTER — Telehealth: Payer: Self-pay

## 2021-10-01 NOTE — Telephone Encounter (Signed)
PC SW sent a referral to [facility], for counseling for patient who lost both her children last month to two separate illness.

## 2021-10-02 ENCOUNTER — Encounter: Payer: Self-pay | Admitting: Internal Medicine

## 2021-10-16 ENCOUNTER — Telehealth: Payer: Self-pay

## 2021-10-16 DIAGNOSIS — Z515 Encounter for palliative care: Secondary | ICD-10-CM

## 2021-10-16 NOTE — Telephone Encounter (Signed)
(  3:44 pm) PC SW scheduled a visit with patient at her home to provide support. The visit is scheduled for 10/23/21 @ 11am.  ?

## 2021-10-23 ENCOUNTER — Other Ambulatory Visit: Payer: Self-pay

## 2021-10-23 DIAGNOSIS — Z515 Encounter for palliative care: Secondary | ICD-10-CM

## 2021-10-23 NOTE — Progress Notes (Signed)
COMMUNITY PALLIATIVE CARE SW NOTE ? ?PATIENT NAME: Kathryn [name] ?DOB: [birth year] ?MRN: [id number] ? ?PRIMARY CARE PROVIDER: Mattie Marlin, MD ? ?RESPONSIBLE PARTY:  ?Acct ID - Guarantor Home Phone Work Phone Relationship Acct Type  ?[id number] Kathryn [name]* [phone]  Self P/F  ?   [address] [place], [place] [address]  ? ? ? ?PLAN OF CARE and INTERVENTIONS:             ?GOALS OF CARE/ ADVANCE CARE PLANNING:  Patient is a Full Code. Goal is for patient to remain in her home and maximize her quality of life.  ?SOCIAL/EMOTIONAL/SPIRITUAL ASSESSMENT/ INTERVENTIONS:  PC SW completed a face-to-face visit with patient in her home. She was present with her sister-Kathryn [name]. Patient provided a status update on herself. She reported that she saw her doctor yesterday. Patient report that she has ongoing pain to her back, which started after her two children died. She was started on gabapentin for that pain. She report that her appetite has declined. Some days she has to force herself to eat because she knows she needs to. Her current weight as of 3/14 is 111 lbs, which is down from 117 lbs 3 months ago. She has not had any falls. Patient takes lorazepam to help her sleep and this has been effective. She is unable to hear out of her right ear and plans to get a hearing aide. Patient experienced some dizziness yesterday, where her daughter has to stay the night with her last night. Patient states "I feel much better today". Patient ambulates independently. She is primary caregiver for her 38 year old sister-Kathryn [name]. She talked about how she is coping with the grief of her two children who passed away days apart. Patient has started bereavement counseling through [facility]. SW provided supportive counseling, assessment of needs and comfort of patient, education, and provided reassurance of support to her. Patient is open to ongoing palliative care/SW  visits and support.  ?PATIENT/CAREGIVER EDUCATION/ COPING:  Patient is  overwhelmed as a caregiver. SW provided some resources that may help. She has good family support.  ?PERSONAL EMERGENCY PLAN:  911 can be accessed for emergencies. ?COMMUNITY RESOURCES COORDINATION/ HEALTH CARE NAVIGATION: Patient receiving bereavement counseling. ?FINANCIAL/LEGAL CONCERNS/INTERVENTIONS:  None. ?   ? ?SOCIAL HX:  ?Social History  ? ?Tobacco Use  ? Smoking status: Never  ? Smokeless tobacco: Not on file  ?Substance Use Topics  ? Alcohol use: No  ? ? ?CODE STATUS: Full Code ?ADVANCED DIRECTIVES: None. ?MOST FORM COMPLETE:  None. ?HOSPICE EDUCATION PROVIDED: Not appropriate. ? ?PPS: Patient is alert and oriented x3. She ambulates independently. Patient continues to experience grief around the loss of her children.  ? ?Duration of visit and documentation: 60 minutes.  ? ? ?Kathryn Llano, LCSW ? ?

## 2023-09-29 ENCOUNTER — Inpatient Hospital Stay (HOSPITAL_COMMUNITY)
Admission: EM | Admit: 2023-09-29 | Discharge: 2023-10-02 | DRG: 083 | Disposition: A | Discharge status: Skilled Nursing Facility | Payer: Medicare Other | Attending: General Surgery | Admitting: General Surgery

## 2023-09-29 ENCOUNTER — Emergency Department (HOSPITAL_COMMUNITY): Payer: Medicare Other

## 2023-09-29 ENCOUNTER — Encounter (HOSPITAL_COMMUNITY): Payer: Self-pay | Admitting: Emergency Medicine

## 2023-09-29 DIAGNOSIS — S51012A Laceration without foreign body of left elbow, initial encounter: Secondary | ICD-10-CM | POA: Diagnosis present

## 2023-09-29 DIAGNOSIS — S32049A Unspecified fracture of fourth lumbar vertebra, initial encounter for closed fracture: Secondary | ICD-10-CM | POA: Diagnosis present

## 2023-09-29 DIAGNOSIS — S41011A Laceration without foreign body of right shoulder, initial encounter: Secondary | ICD-10-CM | POA: Diagnosis present

## 2023-09-29 DIAGNOSIS — D696 Thrombocytopenia, unspecified: Secondary | ICD-10-CM | POA: Diagnosis present

## 2023-09-29 DIAGNOSIS — F17220 Nicotine dependence, chewing tobacco, uncomplicated: Secondary | ICD-10-CM | POA: Diagnosis present

## 2023-09-29 DIAGNOSIS — S32039A Unspecified fracture of third lumbar vertebra, initial encounter for closed fracture: Secondary | ICD-10-CM | POA: Diagnosis present

## 2023-09-29 DIAGNOSIS — R188 Other ascites: Secondary | ICD-10-CM | POA: Diagnosis present

## 2023-09-29 DIAGNOSIS — Z23 Encounter for immunization: Secondary | ICD-10-CM | POA: Diagnosis present

## 2023-09-29 DIAGNOSIS — S060XAA Concussion with loss of consciousness status unknown, initial encounter: Secondary | ICD-10-CM

## 2023-09-29 DIAGNOSIS — Y92009 Unspecified place in unspecified non-institutional (private) residence as the place of occurrence of the external cause: Secondary | ICD-10-CM

## 2023-09-29 DIAGNOSIS — S2249XA Multiple fractures of ribs, unspecified side, initial encounter for closed fracture: Secondary | ICD-10-CM

## 2023-09-29 DIAGNOSIS — S0101XA Laceration without foreign body of scalp, initial encounter: Secondary | ICD-10-CM | POA: Diagnosis present

## 2023-09-29 DIAGNOSIS — S81811A Laceration without foreign body, right lower leg, initial encounter: Secondary | ICD-10-CM | POA: Diagnosis present

## 2023-09-29 DIAGNOSIS — S065XAA Traumatic subdural hemorrhage with loss of consciousness status unknown, initial encounter: Secondary | ICD-10-CM | POA: Diagnosis present

## 2023-09-29 DIAGNOSIS — R161 Splenomegaly, not elsewhere classified: Secondary | ICD-10-CM | POA: Diagnosis present

## 2023-09-29 DIAGNOSIS — S32059A Unspecified fracture of fifth lumbar vertebra, initial encounter for closed fracture: Secondary | ICD-10-CM | POA: Diagnosis present

## 2023-09-29 DIAGNOSIS — W1830XA Fall on same level, unspecified, initial encounter: Secondary | ICD-10-CM | POA: Diagnosis present

## 2023-09-29 DIAGNOSIS — M8580 Other specified disorders of bone density and structure, unspecified site: Secondary | ICD-10-CM | POA: Diagnosis present

## 2023-09-29 DIAGNOSIS — S32029A Unspecified fracture of second lumbar vertebra, initial encounter for closed fracture: Secondary | ICD-10-CM | POA: Diagnosis present

## 2023-09-29 DIAGNOSIS — R54 Age-related physical debility: Secondary | ICD-10-CM | POA: Diagnosis present

## 2023-09-29 DIAGNOSIS — M810 Age-related osteoporosis without current pathological fracture: Secondary | ICD-10-CM | POA: Diagnosis present

## 2023-09-29 DIAGNOSIS — S2243XA Multiple fractures of ribs, bilateral, initial encounter for closed fracture: Secondary | ICD-10-CM | POA: Diagnosis present

## 2023-09-29 DIAGNOSIS — S32019A Unspecified fracture of first lumbar vertebra, initial encounter for closed fracture: Secondary | ICD-10-CM | POA: Diagnosis present

## 2023-09-29 MED ORDER — HYDROMORPHONE HCL 1 MG/ML IJ SOLN
0.5000 mg | INTRAMUSCULAR | Status: DC | PRN
  Administered 2023-09-29 – 2023-10-02 (×10): 0.5 mg via INTRAVENOUS
  Filled 2023-09-29: qty 0.5
  Filled 2023-09-29: qty 1
  Filled 2023-09-29: qty 0.5
  Filled 2023-09-29: qty 1
  Filled 2023-09-29: qty 0.5
  Filled 2023-09-29: qty 1
  Filled 2023-09-29: qty 0.5
  Filled 2023-09-29: qty 1
  Filled 2023-09-29 (×2): qty 0.5

## 2023-09-29 MED ORDER — LEVETIRACETAM IN NACL 500 MG/100ML IV SOLN
500.0000 mg | Freq: Two times a day (BID) | INTRAVENOUS | Status: DC
  Administered 2023-09-29: 500 mg via INTRAVENOUS
  Filled 2023-09-29: qty 100

## 2023-09-29 MED ORDER — ONDANSETRON HCL 4 MG/2ML IJ SOLN: 4.0000 mg | Freq: Four times a day (QID) | INTRAMUSCULAR | Status: DC | PRN

## 2023-09-29 MED ORDER — POLYETHYLENE GLYCOL 3350 17 G PO PACK
17.0000 g | PACK | Freq: Every day | ORAL | Status: DC | PRN
  Administered 2023-10-01: 17 g via ORAL
  Filled 2023-09-29: qty 1

## 2023-09-29 MED ORDER — TRAMADOL HCL 50 MG PO TABS
25.0000 mg | ORAL_TABLET | Freq: Four times a day (QID) | ORAL | Status: DC | PRN
  Administered 2023-09-29 – 2023-10-01 (×7): 25 mg via ORAL
  Filled 2023-09-29 (×7): qty 1

## 2023-09-29 MED ORDER — ONDANSETRON HCL 4 MG/2ML IJ SOLN
INTRAMUSCULAR | Status: AC
  Administered 2023-09-29: 4 mg
  Filled 2023-09-29: qty 2

## 2023-09-29 MED ORDER — LEVETIRACETAM 500 MG PO TABS
500.0000 mg | ORAL_TABLET | Freq: Two times a day (BID) | ORAL | Status: DC
  Administered 2023-09-29 – 2023-10-02 (×6): 500 mg via ORAL
  Filled 2023-09-29 (×6): qty 1

## 2023-09-29 MED ORDER — FENTANYL CITRATE PF 50 MCG/ML IJ SOSY
50.0000 ug | PREFILLED_SYRINGE | Freq: Once | INTRAMUSCULAR | Status: AC
  Administered 2023-09-29: 50 ug via INTRAVENOUS
  Filled 2023-09-29: qty 1

## 2023-09-29 MED ORDER — CHLORHEXIDINE GLUCONATE CLOTH 2 % EX PADS
6.0000 | MEDICATED_PAD | Freq: Every day | CUTANEOUS | Status: DC
  Administered 2023-09-29 – 2023-10-02 (×4): 6 via TOPICAL

## 2023-09-29 MED ORDER — METOPROLOL TARTRATE 5 MG/5ML IV SOLN
5.0000 mg | Freq: Four times a day (QID) | INTRAVENOUS | Status: DC | PRN
  Filled 2023-09-29: qty 5

## 2023-09-29 MED ORDER — ACETAMINOPHEN 500 MG PO TABS
1000.0000 mg | ORAL_TABLET | Freq: Four times a day (QID) | ORAL | Status: DC
  Administered 2023-09-29 – 2023-10-02 (×13): 1000 mg via ORAL
  Filled 2023-09-29 (×14): qty 2

## 2023-09-29 MED ORDER — FENTANYL CITRATE PF 50 MCG/ML IJ SOSY: 50.0000 ug | PREFILLED_SYRINGE | Freq: Once | INTRAMUSCULAR | Status: AC

## 2023-09-29 MED ORDER — ORAL CARE MOUTH RINSE: 15.0000 mL | OROMUCOSAL | Status: DC | PRN

## 2023-09-29 MED ORDER — METHOCARBAMOL 1000 MG/10ML IJ SOLN
500.0000 mg | Freq: Three times a day (TID) | INTRAMUSCULAR | Status: DC
  Administered 2023-09-29: 500 mg via INTRAVENOUS
  Filled 2023-09-29: qty 10

## 2023-09-29 MED ORDER — FLECAINIDE ACETATE 50 MG PO TABS
50.0000 mg | ORAL_TABLET | Freq: Two times a day (BID) | ORAL | Status: DC
  Administered 2023-09-29 – 2023-10-02 (×6): 50 mg via ORAL
  Filled 2023-09-29 (×8): qty 1

## 2023-09-29 MED ORDER — FENTANYL CITRATE PF 50 MCG/ML IJ SOSY
PREFILLED_SYRINGE | INTRAMUSCULAR | Status: AC
  Administered 2023-09-29: 50 ug via INTRAVENOUS
  Filled 2023-09-29: qty 1

## 2023-09-29 MED ORDER — SODIUM CHLORIDE 0.9 % IV SOLN: INTRAVENOUS | Status: AC

## 2023-09-29 MED ORDER — HYDRALAZINE HCL 20 MG/ML IJ SOLN: 10.0000 mg | INTRAMUSCULAR | Status: DC | PRN

## 2023-09-29 MED ORDER — DOCUSATE SODIUM 100 MG PO CAPS
100.0000 mg | ORAL_CAPSULE | Freq: Two times a day (BID) | ORAL | Status: DC
  Administered 2023-09-29 – 2023-10-02 (×6): 100 mg via ORAL
  Filled 2023-09-29 (×6): qty 1

## 2023-09-29 MED ORDER — METHOCARBAMOL 500 MG PO TABS
500.0000 mg | ORAL_TABLET | Freq: Three times a day (TID) | ORAL | Status: DC
  Administered 2023-09-29 – 2023-10-01 (×6): 500 mg via ORAL
  Filled 2023-09-29 (×6): qty 1

## 2023-09-29 MED ORDER — SODIUM CHLORIDE 0.9 % IV BOLUS
1000.0000 mL | Freq: Once | INTRAVENOUS | Status: AC
  Administered 2023-09-29: 1000 mL via INTRAVENOUS

## 2023-09-29 MED ORDER — DILTIAZEM HCL ER COATED BEADS 120 MG PO CP24
120.0000 mg | ORAL_CAPSULE | Freq: Every day | ORAL | Status: DC
  Administered 2023-09-29 – 2023-10-02 (×4): 120 mg via ORAL
  Filled 2023-09-29 (×4): qty 1

## 2023-09-29 MED ORDER — TETANUS-DIPHTH-ACELL PERTUSSIS 5-2.5-18.5 LF-MCG/0.5 IM SUSY
0.5000 mL | PREFILLED_SYRINGE | Freq: Once | INTRAMUSCULAR | Status: AC
  Administered 2023-09-29: 0.5 mL via INTRAMUSCULAR
  Filled 2023-09-29: qty 0.5

## 2023-09-29 MED ORDER — ONDANSETRON 4 MG PO TBDP
4.0000 mg | ORAL_TABLET | Freq: Four times a day (QID) | ORAL | Status: DC | PRN
  Administered 2023-09-30: 4 mg via ORAL
  Filled 2023-09-29: qty 1

## 2023-09-29 MED ORDER — IOHEXOL 350 MG/ML SOLN
80.0000 mL | Freq: Once | INTRAVENOUS | Status: AC | PRN
  Administered 2023-09-29: 80 mL via INTRAVENOUS

## 2023-09-30 ENCOUNTER — Inpatient Hospital Stay (HOSPITAL_COMMUNITY): Payer: Medicare Other

## 2023-09-30 ENCOUNTER — Encounter (HOSPITAL_COMMUNITY): Payer: Self-pay

## 2023-09-30 MED ORDER — ENSURE ENLIVE PO LIQD
237.0000 mL | Freq: Two times a day (BID) | ORAL | Status: DC
  Administered 2023-10-01 – 2023-10-02 (×3): 237 mL via ORAL

## 2023-09-30 MED ORDER — LIDOCAINE 5 % EX PTCH
2.0000 | MEDICATED_PATCH | CUTANEOUS | Status: DC
  Administered 2023-09-30 – 2023-10-01 (×2): 2 via TRANSDERMAL
  Filled 2023-09-30 (×2): qty 2

## 2023-09-30 MED ORDER — INFLUENZA VAC A&B SURF ANT ADJ 0.5 ML IM SUSY
0.5000 mL | PREFILLED_SYRINGE | INTRAMUSCULAR | Status: AC
  Administered 2023-10-01: 0.5 mL via INTRAMUSCULAR
  Filled 2023-09-30: qty 0.5

## 2023-10-01 ENCOUNTER — Encounter (HOSPITAL_COMMUNITY): Payer: Self-pay

## 2023-10-01 MED ORDER — TRAMADOL HCL 50 MG PO TABS
50.0000 mg | ORAL_TABLET | Freq: Four times a day (QID) | ORAL | Status: DC | PRN
  Administered 2023-10-01 – 2023-10-02 (×3): 50 mg via ORAL
  Filled 2023-10-01 (×3): qty 1

## 2023-10-01 MED ORDER — ENOXAPARIN SODIUM 30 MG/0.3ML IJ SOSY
30.0000 mg | PREFILLED_SYRINGE | Freq: Two times a day (BID) | INTRAMUSCULAR | Status: DC
  Administered 2023-10-01 – 2023-10-02 (×2): 30 mg via SUBCUTANEOUS
  Filled 2023-10-01 (×2): qty 0.3

## 2023-10-01 MED ORDER — MUPIROCIN 2 % EX OINT
TOPICAL_OINTMENT | Freq: Two times a day (BID) | CUTANEOUS | Status: DC
Start: 2023-10-01 — End: 2023-10-02
  Administered 2023-10-01: 1 via TOPICAL
  Filled 2023-10-01: qty 22

## 2023-10-01 MED ORDER — METHOCARBAMOL 750 MG PO TABS
750.0000 mg | ORAL_TABLET | Freq: Three times a day (TID) | ORAL | Status: DC
  Administered 2023-10-01 – 2023-10-02 (×4): 750 mg via ORAL
  Filled 2023-10-01 (×4): qty 1

## 2023-10-02 ENCOUNTER — Encounter: Payer: Self-pay | Admitting: Internal Medicine

## 2023-10-02 LAB — CBC
HCT: 25.6 % — ABNORMAL LOW (ref 36.0–46.0)
Hemoglobin: 8.2 g/dL — ABNORMAL LOW (ref 12.0–15.0)
MCH: 28.7 pg (ref 26.0–34.0)
MCHC: 32 g/dL (ref 30.0–36.0)
MCV: 89.5 fL (ref 80.0–100.0)
Platelets: 131 10*3/uL — ABNORMAL LOW (ref 150–400)
RBC: 2.86 MIL/uL — ABNORMAL LOW (ref 3.87–5.11)
RDW: 17.6 % — ABNORMAL HIGH (ref 11.5–15.5)
WBC: 4.6 10*3/uL (ref 4.0–10.5)
nRBC: 0 % (ref 0.0–0.2)

## 2023-10-02 LAB — BASIC METABOLIC PANEL
Anion gap: 9 (ref 5–15)
BUN: 13 mg/dL (ref 8–23)
CO2: 30 mmol/L (ref 22–32)
Calcium: 9.7 mg/dL (ref 8.9–10.3)
Chloride: 94 mmol/L — ABNORMAL LOW (ref 98–111)
Creatinine, Ser: 0.67 mg/dL (ref 0.44–1.00)
GFR, Estimated: >60 mL/min (ref >60)
Glucose, Bld: 111 mg/dL — ABNORMAL HIGH (ref 70–99)
Potassium: 3.4 mmol/L — ABNORMAL LOW (ref 3.5–5.1)
Sodium: 133 mmol/L — ABNORMAL LOW (ref 135–145)

## 2023-10-02 MED ORDER — METHOCARBAMOL 750 MG PO TABS: 750.0000 mg | ORAL_TABLET | Freq: Three times a day (TID) | ORAL | Status: AC

## 2023-10-02 MED ORDER — MUPIROCIN 2 % EX OINT: TOPICAL_OINTMENT | Freq: Two times a day (BID) | CUTANEOUS | Status: AC

## 2023-10-02 MED ORDER — TRAMADOL HCL 50 MG PO TABS: 50.0000 mg | ORAL_TABLET | Freq: Four times a day (QID) | ORAL | 0 refills | Status: AC | PRN

## 2023-10-02 MED ORDER — DOCUSATE SODIUM 100 MG PO CAPS: 100.0000 mg | ORAL_CAPSULE | Freq: Two times a day (BID) | ORAL | Status: AC

## 2023-10-02 MED ORDER — ENSURE ENLIVE PO LIQD: 237.0000 mL | Freq: Two times a day (BID) | ORAL | Status: AC

## 2023-10-02 MED ORDER — LIDOCAINE 5 % EX PTCH: 2.0000 | MEDICATED_PATCH | CUTANEOUS | Status: AC

## 2023-10-02 MED ORDER — POLYETHYLENE GLYCOL 3350 17 G PO PACK: 17.0000 g | PACK | Freq: Two times a day (BID) | ORAL | Status: AC

## 2023-10-02 MED ORDER — CARMEX CLASSIC LIP BALM EX OINT
TOPICAL_OINTMENT | CUTANEOUS | Status: DC | PRN
  Filled 2023-10-02: qty 10

## 2023-10-02 MED ORDER — LEVETIRACETAM 500 MG PO TABS: 500.0000 mg | ORAL_TABLET | Freq: Two times a day (BID) | ORAL | Status: AC

## 2023-10-02 MED ORDER — ACETAMINOPHEN 500 MG PO TABS: 1000.0000 mg | ORAL_TABLET | Freq: Four times a day (QID) | ORAL | Status: AC | PRN

## 2023-10-02 NOTE — TOC Transition Note (Signed)
Transition of Care [facility]) - Discharge Note   Patient Details  Name: Kathryn [name] MRN: [id number] Date of Birth: 1931-11-11  [facility] of [facility] CM/SW Contact:  Glennon Mac, RN Phone Number: 10/02/2023, 12:29pm   Clinical Narrative:    Patient medically stable for discharge to [facility] today.  Discharge summary and transfer packet forwarded to [place] in admissions at facility.  Bedside nurse to call report to [phone]; patient to go to [facility], [facility] wing.  PTAR notified for transport at 12:29pm; estimate pickup time within one hour.  Notified patient's daughter, Zella Ball of discharge arrangements, and she is in agreement with plan.  Bedside nurse to call daughter when patient is picked up for transfer.    Final next level of care: Skilled Nursing Facility Barriers to Discharge: Barriers Resolved   Patient Goals and CMS Choice   CMS Medicare.gov Compare Post Acute Care list provided to:: Patient Represenative (must comment) (Daughter)        Discharge Placement PASRR number recieved: 09/29/23            Patient chooses bed at: [facility] Patient to be transferred to facility by: PTAR Name of family member notified: Horris Latino    Discharge Plan and Services Additional resources added to the After Visit Summary for     Discharge Planning Services: CM Consult Post Acute Care Choice: Skilled Nursing Facility                               Social Drivers of Health (SDOH) Interventions SDOH Screenings   Food Insecurity: No Food Insecurity (09/30/2023)  Housing: Low Risk  (09/30/2023)  Transportation Needs: No Transportation Needs (09/30/2023)  Utilities: Not At Risk (09/30/2023)  Social Connections: Moderately Isolated (09/30/2023)  Tobacco Use: High Risk (10/01/2023)     Readmission Risk Interventions     No data to display         Quintella Baton, RN, BSN  Trauma/Neuro ICU Case Manager [phone]

## 2023-10-02 NOTE — Care Management Important Message (Signed)
Important Message  Patient Details  Name: Kathryn [name] MRN: [id number] Date of Birth: 15-Feb-1932   Important Message Given:  Yes - Medicare IM     Sherilyn Banker 10/02/2023, 3:12 PM

## 2024-09-11 DEATH — deceased
